# Patient Record
Sex: Female | Born: 1985 | Race: Black or African American | Hispanic: No | Marital: Single
Health system: Southern US, Community
[De-identification: ages and names within clinical notes are randomized; demographics above are authoritative.]

---

## 2016-10-28 ENCOUNTER — Emergency Department: Payer: Medicaid Other

## 2016-10-28 ENCOUNTER — Emergency Department
Admission: EM | Admit: 2016-10-28 | Discharge: 2016-10-29 | Disposition: A | Discharge status: Home / Self Care | Payer: Medicaid Other | Attending: Emergency Medicine | Admitting: Emergency Medicine

## 2016-10-28 DIAGNOSIS — R55 Syncope and collapse: Secondary | ICD-10-CM | POA: Insufficient documentation

## 2016-10-28 DIAGNOSIS — R079 Chest pain, unspecified: Secondary | ICD-10-CM | POA: Diagnosis not present

## 2016-10-28 LAB — TROPONIN I: Troponin I: <0.03 ng/mL (ref <0.03)

## 2016-10-28 LAB — BASIC METABOLIC PANEL
Anion gap: 7 (ref 5–15)
BUN: 8 mg/dL (ref 6–20)
CHLORIDE: 104 mmol/L (ref 101–111)
CO2: 26 mmol/L (ref 22–32)
Calcium: 9.3 mg/dL (ref 8.9–10.3)
Creatinine, Ser: 0.71 mg/dL (ref 0.44–1.00)
GFR calc Af Amer: >60 mL/min (ref >60)
Glucose, Bld: 128 mg/dL — ABNORMAL HIGH (ref 65–99)
POTASSIUM: 3.4 mmol/L — AB (ref 3.5–5.1)
Sodium: 137 mmol/L (ref 135–145)

## 2016-10-28 LAB — CBC
HCT: 40.1 % (ref 35.0–47.0)
Hemoglobin: 13.5 g/dL (ref 12.0–16.0)
MCH: 28.4 pg (ref 26.0–34.0)
MCHC: 33.7 g/dL (ref 32.0–36.0)
MCV: 84.3 fL (ref 80.0–100.0)
PLATELETS: 133 10*3/uL — AB (ref 150–440)
RBC: 4.76 MIL/uL (ref 3.80–5.20)
RDW: 12.6 % (ref 11.5–14.5)
WBC: 4.8 10*3/uL (ref 3.6–11.0)

## 2016-10-28 NOTE — ED Triage Notes (Signed)
Pt in with co chest pain x 2 weeks with syncopal episodes. States has had 3 episodes in the last 2 weeks. Also has had cold symptoms with funny nose and congestion the last 2 weeks.

## 2016-10-29 LAB — FIBRIN DERIVATIVES D-DIMER (ARMC ONLY): FIBRIN DERIVATIVES D-DIMER (ARMC): 211.84 (ref 0.00–499.00)

## 2016-10-29 NOTE — ED Notes (Signed)
Dr. Brown at the bedside for pt evaluation 

## 2016-10-29 NOTE — ED Notes (Signed)
D&C IV 

## 2016-10-29 NOTE — ED Provider Notes (Signed)
[facility] Emergency Department Provider Note    First MD Initiated Contact with Patient 10/28/16 2341     (approximate)  I have reviewed the triage vital signs and the nursing notes.   HISTORY  Chief Complaint Chest Pain    HPI Jeanette Huang is a 31 y.o. female presents with 2 week history of intermittent central chest discomfort and syncope. Patient states that she's had 3 syncopal episodes in the last 3 weeks. Patient states each syncopal episode occurred while having a verbal altercation. Patient denies any dyspnea no lower extremity pain or swelling. No personal history of DVT or PE no known cardiac disease personally or in her family. Patient denies any headache no weakness numbness gait instability or visual changes. Patient states that she had bilateral arm leg weakness numbness and tingling before and following each event upon regaining of consciousness   Past medical history None There are no active problems to display for this patient.  Past surgical history None  Prior to Admission medications   Not on File    Allergies Patient has no known allergies.  No family history on file.  Social History Social History  Substance Use Topics  . Smoking status: Not on file  . Smokeless tobacco: Not on file  . Alcohol use Not on file    Review of Systems Constitutional: No fever/chills Eyes: No visual changes. ENT: No sore throat. Cardiovascular: Positive for chest pain. Respiratory: Denies shortness of breath. Gastrointestinal: No abdominal pain.  No nausea, no vomiting.  No diarrhea.  No constipation. Genitourinary: Negative for dysuria. Musculoskeletal: Negative for back pain. Integumentary: Negative for rash. Neurological: Negative for headaches, focal weakness or numbness. Positive for syncope  ____________________________________________   PHYSICAL EXAM:  VITAL SIGNS: ED Triage Vitals  Enc Vitals Group     BP 10/28/16  2211 128/85     Pulse Rate 10/28/16 2211 88     Resp 10/28/16 2211 16     Temp 10/28/16 2211 98.6 F (37 C)     Temp Source 10/28/16 2211 Oral     SpO2 10/28/16 2211 98 %     Weight 10/28/16 2205 140 lb (63.5 kg)     Height 10/28/16 2205 5\' 5"  (1.651 m)     Head Circumference --      Peak Flow --      Pain Score 10/28/16 2205 3     Pain Loc --      Pain Edu? --      Excl. in GC? --     Constitutional: Alert and oriented. Well appearing and in no acute distress. Eyes: Conjunctivae are normal. PERRL. EOMI. Head: Atraumatic. Mouth/Throat: Mucous membranes are moist. Oropharynx non-erythematous. Neck: No stridor.  Cardiovascular: Normal rate, regular rhythm. Good peripheral circulation. Grossly normal heart sounds. Respiratory: Normal respiratory effort.  No retractions. Lungs CTAB. Gastrointestinal: Soft and nontender. No distention.  Musculoskeletal: No lower extremity tenderness nor edema. No gross deformities of extremities. Neurologic:  Normal speech and language. No gross focal neurologic deficits are appreciated.  Skin:  Skin is warm, dry and intact. No rash noted. Psychiatric: Mood and affect are normal. Speech and behavior are normal.  ____________________________________________   LABS (all labs ordered are listed, but only abnormal results are displayed)  Labs Reviewed  CBC - Abnormal; Notable for the following:       Result Value   Platelets 133 (*)    All other components within normal limits  BASIC METABOLIC PANEL - Abnormal;  Notable for the following:    Potassium 3.4 (*)    Glucose, Bld 128 (*)    All other components within normal limits  TROPONIN I  FIBRIN DERIVATIVES D-DIMER ([facility] ONLY)  TROPONIN I   ____________________________________________  EKG  ED ECG REPORT I, [place] N Makenzy Krist, the attending physician, personally viewed and interpreted this ECG.   Date: 10/29/2016  EKG Time: 10:06 PM  Rate: 86  Rhythm: Normal sinus rhythm  Axis:  Normal  Intervals: Normal  ST&T Change: None  ____________________________________________  RADIOLOGY I, [place] N Karsten Howry, personally viewed and evaluated these images (plain radiographs) as part of my medical decision making, as well as reviewing the written report by the radiologist.  Dg Chest 2 View  Result Date: 10/28/2016 CLINICAL DATA:  Chest pain EXAM: CHEST  2 VIEW COMPARISON:  None. FINDINGS: The heart size and mediastinal contours are within normal limits. Both lungs are clear. The visualized skeletal structures are unremarkable. IMPRESSION: No active cardiopulmonary disease. Electronically Signed   By: Deatra Robinson M.D.   On: 10/28/2016 22:25      Procedures   ____________________________________________   INITIAL IMPRESSION / ASSESSMENT AND PLAN / ED COURSE  Pertinent labs & imaging results that were available during my care of the patient were reviewed by me and considered in my medical decision making (see chart for details).  31 year old female presenting with syncope during verbal altercations. EKG revealed no evidence of ischemia or infarction QTc 466 no arrhythmia. Laboratory data unremarkable including troponin and d-dimer. Patient will be referred to cardiology for further outpatient evaluation and management.      ____________________________________________  FINAL CLINICAL IMPRESSION(S) / ED DIAGNOSES  Final diagnoses:  Chest pain, unspecified type  Syncope, unspecified syncope type     MEDICATIONS GIVEN DURING THIS VISIT:  Medications - No data to display   NEW OUTPATIENT MEDICATIONS STARTED DURING THIS VISIT:  New Prescriptions   No medications on file    Modified Medications   No medications on file    Discontinued Medications   No medications on file     Note:  This document was prepared using Dragon voice recognition software and may include unintentional dictation errors.    Darci Current, MD 10/29/16 [phone]

## 2018-03-16 IMAGING — CR DG CHEST 2V
2 series · 2 of 2 positions shown · non-contrast
Comparison: None.

CLINICAL DATA: Chest pain

EXAM:
CHEST  2 VIEW

[chest pa]
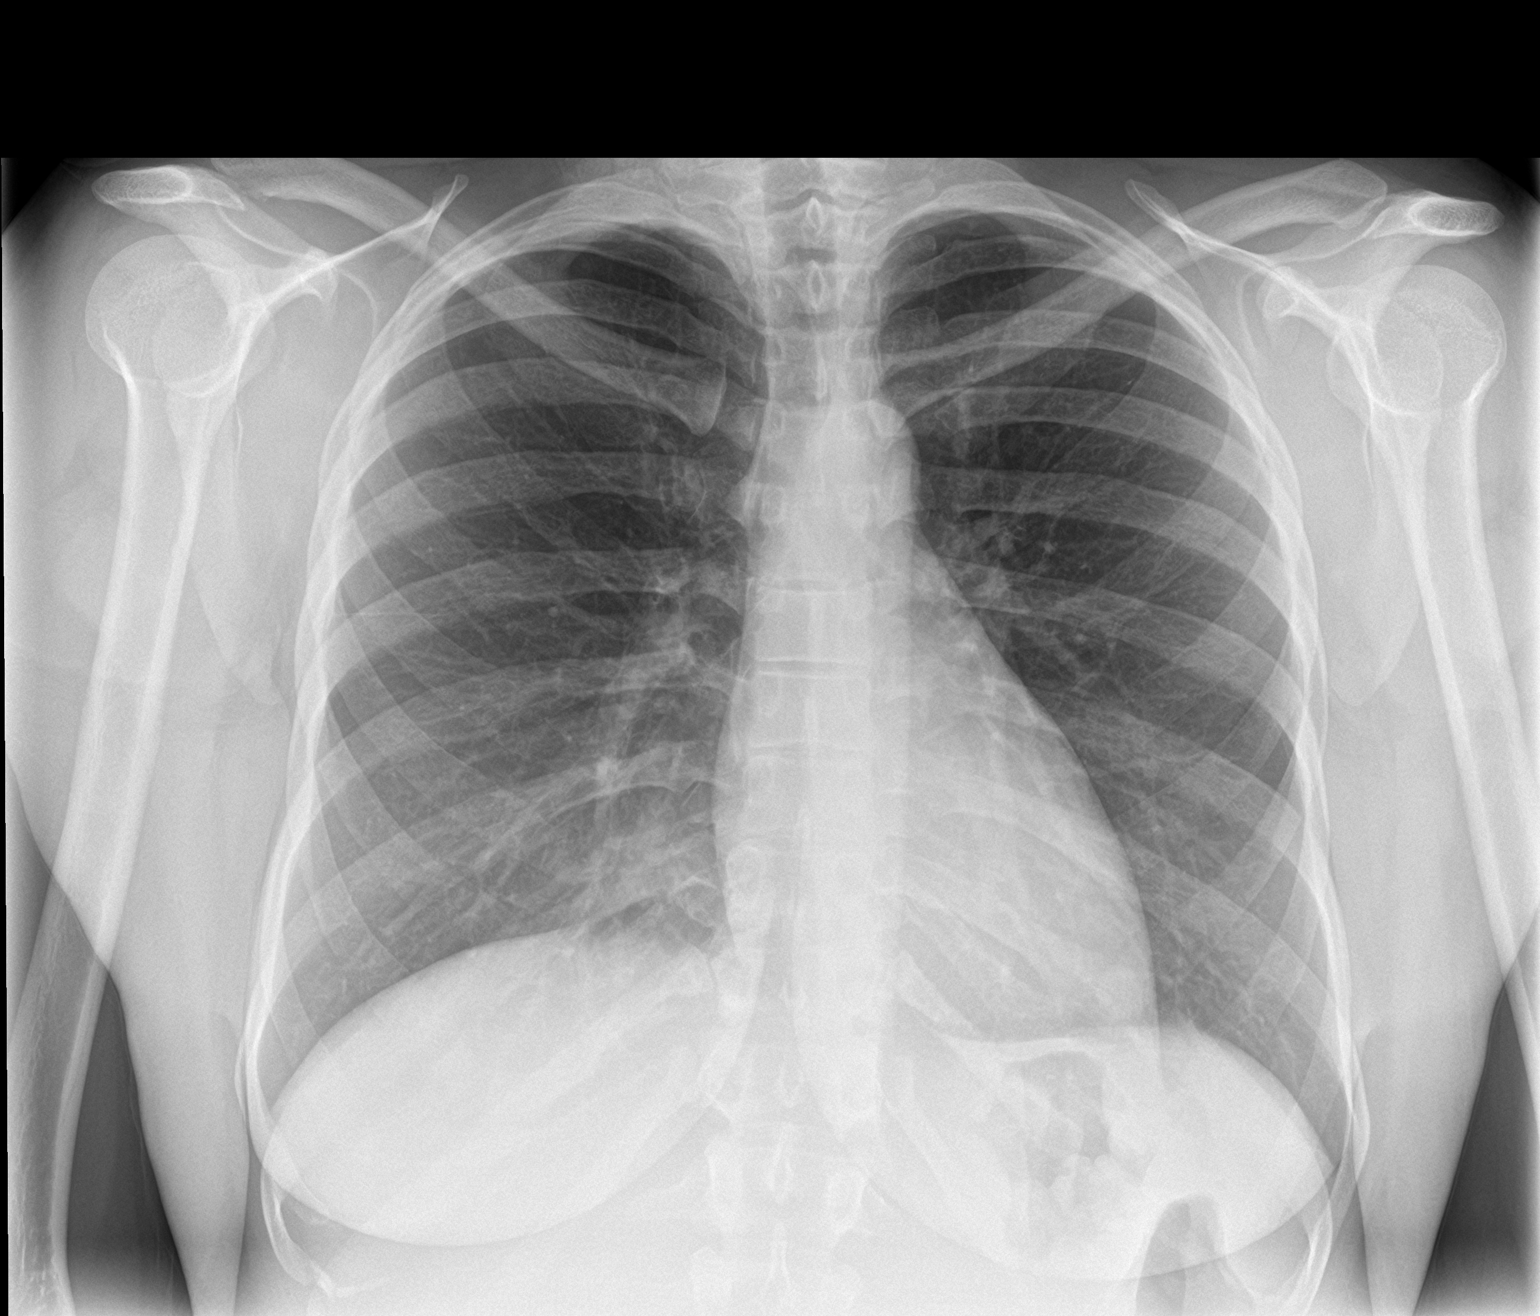

[chest lat]
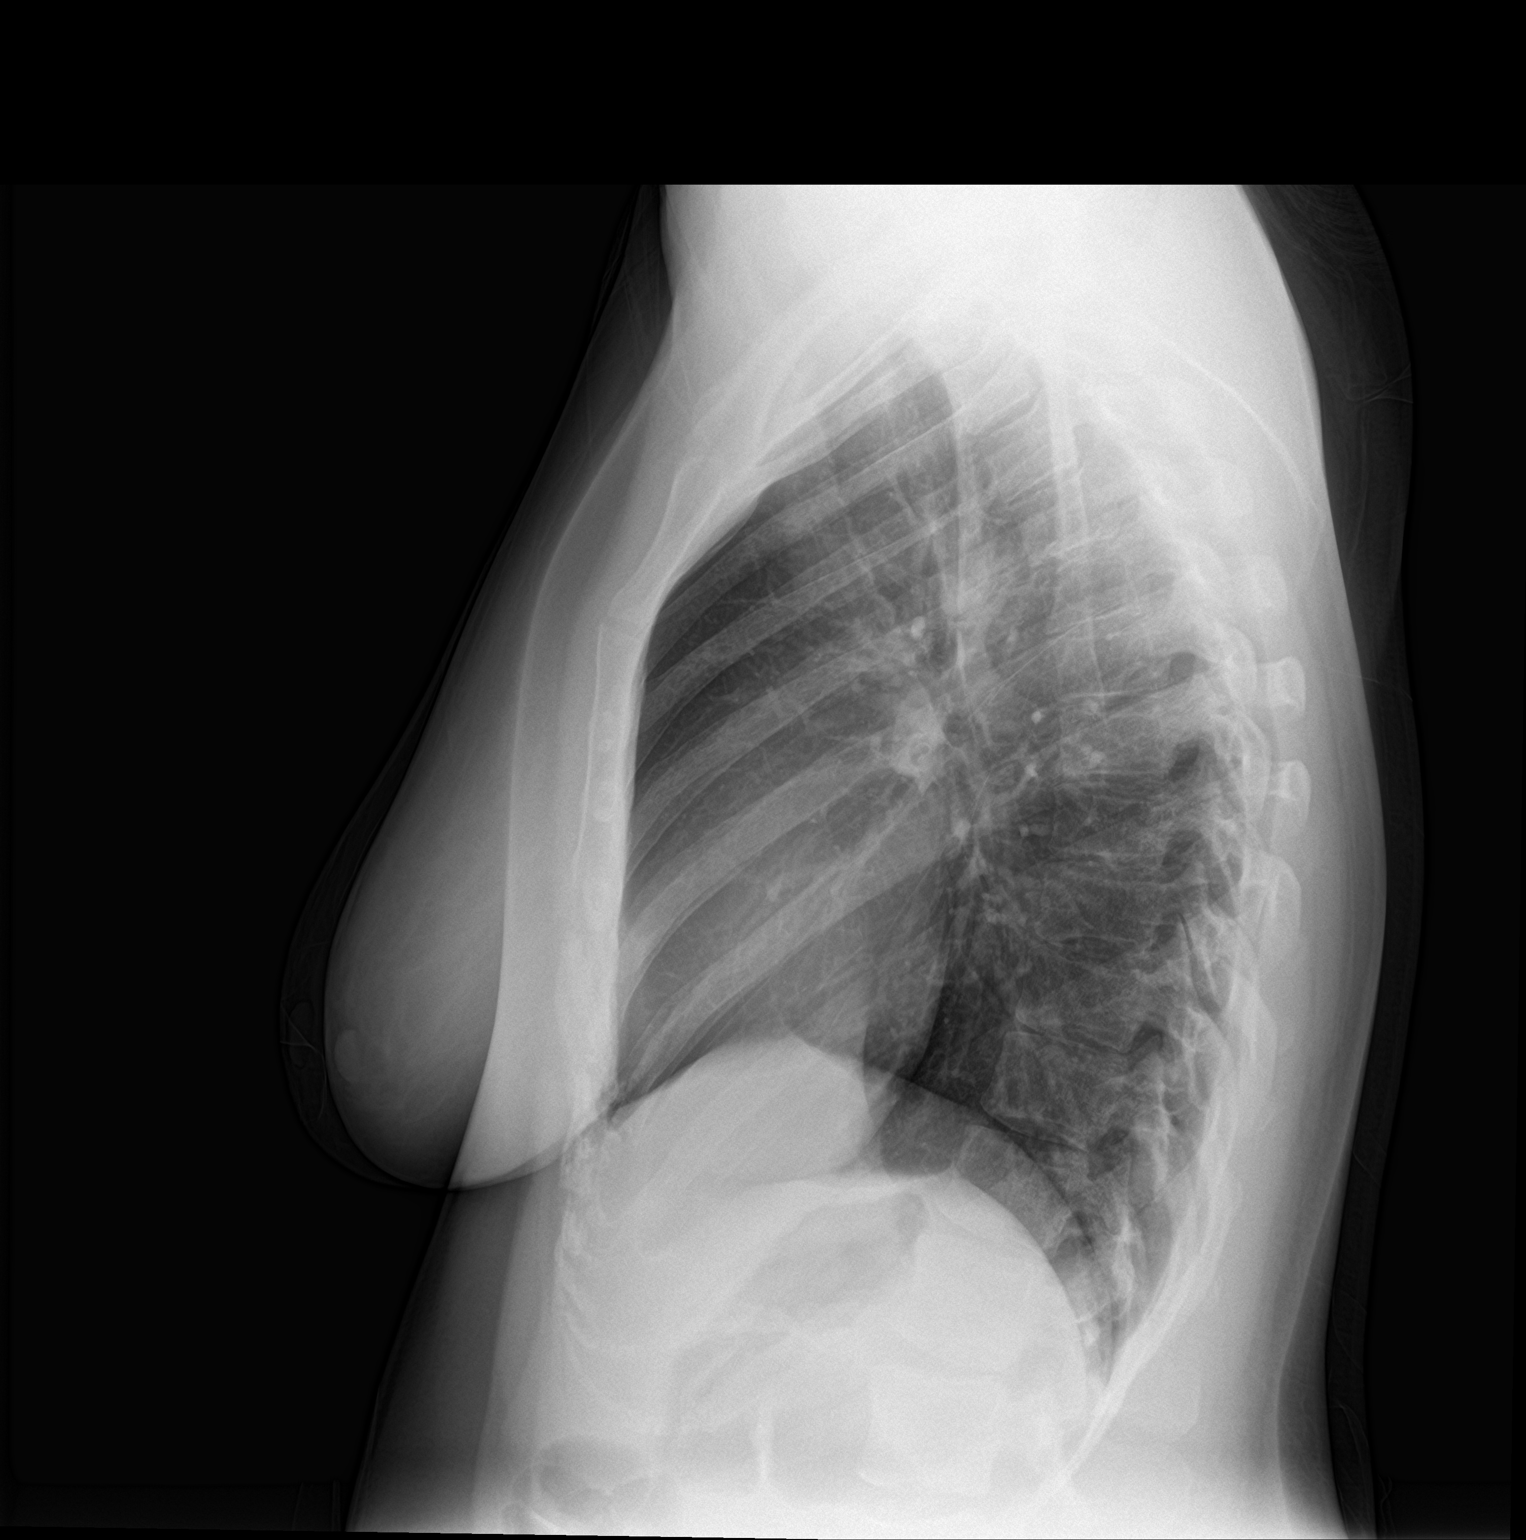

[2 of 2 positions shown; findings below may reference images not displayed]

FINDINGS: The heart size and mediastinal contours are within normal limits.
Both lungs are clear. The visualized skeletal structures are
unremarkable.
IMPRESSION: No active cardiopulmonary disease.
